# Patient Record
Sex: Female | Born: 1955 | Race: Black or African American | Hispanic: No | Marital: Married | Smoking: Never smoker
Health system: Southern US, Community
[De-identification: ages and names within clinical notes are randomized; demographics above are authoritative.]

## PROBLEM LIST (undated history)

## (undated) DIAGNOSIS — I10 Essential (primary) hypertension: Secondary | ICD-10-CM

---

## 1997-08-31 ENCOUNTER — Emergency Department (HOSPITAL_COMMUNITY): Admission: EM | Admit: 1997-08-31 | Discharge: 1997-08-31 | Payer: Self-pay

## 2001-01-12 ENCOUNTER — Emergency Department (HOSPITAL_COMMUNITY): Admission: EM | Admit: 2001-01-12 | Discharge: 2001-01-12 | Payer: Self-pay

## 2002-04-22 ENCOUNTER — Encounter: Payer: Self-pay | Admitting: Emergency Medicine

## 2002-04-22 ENCOUNTER — Emergency Department (HOSPITAL_COMMUNITY): Admission: EM | Admit: 2002-04-22 | Discharge: 2002-04-22 | Payer: Self-pay | Admitting: Emergency Medicine

## 2003-09-12 ENCOUNTER — Ambulatory Visit: Payer: Self-pay | Admitting: *Deleted

## 2004-06-19 ENCOUNTER — Emergency Department (HOSPITAL_COMMUNITY): Admission: EM | Admit: 2004-06-19 | Discharge: 2004-06-20 | Payer: Self-pay | Admitting: Emergency Medicine

## 2004-07-13 ENCOUNTER — Emergency Department (HOSPITAL_COMMUNITY): Admission: EM | Admit: 2004-07-13 | Discharge: 2004-07-13 | Payer: Self-pay | Admitting: Emergency Medicine

## 2004-08-20 ENCOUNTER — Ambulatory Visit: Payer: Self-pay | Admitting: Internal Medicine

## 2004-08-28 ENCOUNTER — Ambulatory Visit (HOSPITAL_COMMUNITY): Admission: RE | Admit: 2004-08-28 | Discharge: 2004-08-28 | Payer: Self-pay | Admitting: Family Medicine

## 2004-09-02 ENCOUNTER — Ambulatory Visit: Payer: Self-pay | Admitting: Internal Medicine

## 2004-09-25 ENCOUNTER — Ambulatory Visit: Payer: Self-pay | Admitting: Internal Medicine

## 2005-02-19 ENCOUNTER — Ambulatory Visit: Payer: Self-pay | Admitting: Internal Medicine

## 2005-05-27 ENCOUNTER — Ambulatory Visit: Payer: Self-pay | Admitting: Internal Medicine

## 2005-05-27 ENCOUNTER — Encounter: Payer: Self-pay | Admitting: Internal Medicine

## 2009-10-09 ENCOUNTER — Emergency Department (HOSPITAL_COMMUNITY): Admission: EM | Admit: 2009-10-09 | Discharge: 2009-10-09 | Payer: Self-pay | Admitting: Emergency Medicine

## 2009-12-26 ENCOUNTER — Emergency Department (HOSPITAL_COMMUNITY)
Admission: EM | Admit: 2009-12-26 | Discharge: 2009-12-26 | Payer: Self-pay | Source: Home / Self Care | Admitting: Emergency Medicine

## 2010-03-28 ENCOUNTER — Encounter (INDEPENDENT_AMBULATORY_CARE_PROVIDER_SITE_OTHER): Payer: Self-pay | Admitting: *Deleted

## 2010-03-28 LAB — CONVERTED CEMR LAB
ALT: 9 units/L (ref 0–35)
CO2: 25 meq/L (ref 19–32)
Chloride: 102 meq/L (ref 96–112)
Glucose, Bld: 72 mg/dL (ref 70–99)
Potassium: 4.7 meq/L (ref 3.5–5.3)
Total Protein: 7.8 g/dL (ref 6.0–8.3)

## 2010-08-12 ENCOUNTER — Other Ambulatory Visit: Payer: Self-pay | Admitting: Family Medicine

## 2012-05-23 ENCOUNTER — Encounter (HOSPITAL_COMMUNITY): Payer: Self-pay | Admitting: *Deleted

## 2012-05-23 ENCOUNTER — Emergency Department (HOSPITAL_COMMUNITY)
Admission: EM | Admit: 2012-05-23 | Discharge: 2012-05-23 | Disposition: A | Discharge status: Home / Self Care | Payer: Self-pay | Attending: Emergency Medicine | Admitting: Emergency Medicine

## 2012-05-23 DIAGNOSIS — I10 Essential (primary) hypertension: Secondary | ICD-10-CM | POA: Insufficient documentation

## 2012-05-23 DIAGNOSIS — K029 Dental caries, unspecified: Secondary | ICD-10-CM | POA: Insufficient documentation

## 2012-05-23 DIAGNOSIS — K089 Disorder of teeth and supporting structures, unspecified: Secondary | ICD-10-CM | POA: Insufficient documentation

## 2012-05-23 HISTORY — DX: Essential (primary) hypertension: I10

## 2012-05-23 MED ORDER — OXYCODONE-ACETAMINOPHEN 5-325 MG PO TABS: 2.0000 | ORAL_TABLET | Freq: Four times a day (QID) | ORAL | Status: DC | PRN

## 2012-05-23 NOTE — ED Notes (Signed)
Pt reports lower right side dental pain since Thursday. Airway intact

## 2012-05-23 NOTE — ED Provider Notes (Signed)
History     CSN: [id number]  Arrival date & time 05/23/12  4098   First MD Initiated Contact with Patient 05/23/12 0840      Chief Complaint  Patient presents with  . Dental Pain    (Consider location/radiation/quality/duration/timing/severity/associated sxs/prior treatment) The history is provided by the patient.  Mary Chan is a 57 y.o. female history of hypertension, poor dentition here presenting with right sided dental pain. Right lower dental pain for the last 3 days. She noticed that her filling came out 3 days ago and it's been painful when she eats. The tooth is also very since it to heat or cold. Denies any fevers or chills or facial swelling. Hasn't seen a dentist for several years.    Past Medical History  Diagnosis Date  . Hypertension     History reviewed. No pertinent past surgical history.  History reviewed. No pertinent family history.  History  Substance Use Topics  . Smoking status: Not on file  . Smokeless tobacco: Not on file  . Alcohol Use: No    OB History   Grav Para Term Preterm Abortions TAB SAB Ect Mult Living                  Review of Systems  HENT: Positive for dental problem.   All other systems reviewed and are negative.    Allergies  Review of patient's allergies indicates no known allergies.  Home Medications   Current Outpatient Rx  Name  Route  Sig  Dispense  Refill  . oxyCODONE-acetaminophen (PERCOCET) 5-325 MG per tablet   Oral   Take 2 tablets by mouth every 6 (six) hours as needed for pain.   8 tablet   0     BP 128/76  Pulse 88  Temp(Src) 98.8 F (37.1 C) (Oral)  Resp 18  SpO2 99%  Physical Exam  Nursing note and vitals reviewed. Constitutional: She appears well-developed and well-nourished.  HENT:  Head: Normocephalic.  Mouth/Throat:    Poor dentition overall with multiple fillings. R second molar there is a missing filling. No purulent drainage. Gums are not swollen and no evidence of abscess     Eyes: Conjunctivae are normal. Pupils are equal, round, and reactive to light.  Neck: Normal range of motion. Neck supple.  No cervical LAD   Cardiovascular: Normal rate, regular rhythm and normal heart sounds.   Pulmonary/Chest: Effort normal and breath sounds normal. No respiratory distress. She has no wheezes. She has no rales.  Abdominal: Soft. Bowel sounds are normal. She exhibits no distension. There is no tenderness. There is no rebound and no guarding.  Musculoskeletal: Normal range of motion.  Neurological: She is alert.  Skin: Skin is warm and dry.  Psychiatric: She has a normal mood and affect. Her behavior is normal. Judgment and thought content normal.    ED Course  Procedures (including critical care time)  Labs Reviewed - No data to display No results found.   1. Pain due to dental caries       MDM  Mary Chan is a 57 y.o. female here with missing filling. No evidence of infection. Will give short course of percocet prn. Will refer her to dental clinic.         Richardean Canal, MD 05/23/12 [phone]

## 2012-05-23 NOTE — ED Notes (Signed)
Pt states R lower dental pain, ongoing since Thursday, states she does not have dentist at the time. 9/10 pain. Respirations unlabored. No signs of acute distress at the time.

## 2012-08-25 ENCOUNTER — Emergency Department (HOSPITAL_COMMUNITY)
Admission: EM | Admit: 2012-08-25 | Discharge: 2012-08-25 | Disposition: A | Discharge status: Home / Self Care | Payer: No Typology Code available for payment source | Attending: Emergency Medicine | Admitting: Emergency Medicine

## 2012-08-25 ENCOUNTER — Encounter (HOSPITAL_COMMUNITY): Payer: Self-pay | Admitting: Emergency Medicine

## 2012-08-25 DIAGNOSIS — Z79899 Other long term (current) drug therapy: Secondary | ICD-10-CM | POA: Insufficient documentation

## 2012-08-25 DIAGNOSIS — K047 Periapical abscess without sinus: Secondary | ICD-10-CM

## 2012-08-25 DIAGNOSIS — K044 Acute apical periodontitis of pulpal origin: Secondary | ICD-10-CM | POA: Insufficient documentation

## 2012-08-25 DIAGNOSIS — I10 Essential (primary) hypertension: Secondary | ICD-10-CM | POA: Insufficient documentation

## 2012-08-25 DIAGNOSIS — Z792 Long term (current) use of antibiotics: Secondary | ICD-10-CM | POA: Insufficient documentation

## 2012-08-25 MED ORDER — IBUPROFEN 600 MG PO TABS: 600.0000 mg | ORAL_TABLET | Freq: Three times a day (TID) | ORAL | Status: AC | PRN

## 2012-08-25 MED ORDER — IBUPROFEN 200 MG PO TABS
600.0000 mg | ORAL_TABLET | Freq: Once | ORAL | Status: AC
  Administered 2012-08-25: 600 mg via ORAL
  Filled 2012-08-25: qty 3

## 2012-08-25 MED ORDER — OXYCODONE-ACETAMINOPHEN 5-325 MG PO TABS: 1.0000 | ORAL_TABLET | ORAL | Status: AC | PRN

## 2012-08-25 MED ORDER — PENICILLIN V POTASSIUM 500 MG PO TABS: 500.0000 mg | ORAL_TABLET | Freq: Four times a day (QID) | ORAL | Status: AC

## 2012-08-25 MED ORDER — OXYCODONE-ACETAMINOPHEN 5-325 MG PO TABS
1.0000 | ORAL_TABLET | Freq: Once | ORAL | Status: AC
  Administered 2012-08-25: 1 via ORAL
  Filled 2012-08-25: qty 1

## 2012-08-25 NOTE — ED Provider Notes (Signed)
CSN: [id number]     Arrival date & time 08/25/12  1014 History     First MD Initiated Contact with Patient 08/25/12 1119     Chief Complaint  Patient presents with  . Facial Swelling   HPI The patients presents with 5 days of dental pain. The pain is moderate. It is worsened by nothing. It is improved by nothing including NSAIDs. The patient denies recent dental trauma. They deny fever or chills. They have not sought prior care for this. It is described as aching and sore. The dental pain is located in right lower first molar. There is associated facial swelling.  No difficulty breathing or swallowing.  No fevers or chills.  Past Medical History  Diagnosis Date  . Hypertension    No past surgical history on file. No family history on file. History  Substance Use Topics  . Smoking status: Not on file  . Smokeless tobacco: Not on file  . Alcohol Use: No   OB History   Grav Para Term Preterm Abortions TAB SAB Ect Mult Living                 Review of Systems  All other systems reviewed and are negative.    Allergies  Review of patient's allergies indicates no known allergies.  Home Medications   Current Outpatient Rx  Name  Route  Sig  Dispense  Refill  . acetaminophen (TYLENOL) 500 MG tablet   Oral   Take 1,000 mg by mouth every 6 (six) hours as needed for pain.         Marland Kitchen amLODipine-benazepril (LOTREL) 10-20 MG per capsule   Oral   Take 1 capsule by mouth daily.         Marland Kitchen ibuprofen (ADVIL,MOTRIN) 200 MG tablet   Oral   Take 400 mg by mouth every 8 (eight) hours as needed for pain.         . Multiple Vitamin (MULTIVITAMIN WITH MINERALS) TABS tablet   Oral   Take 1 tablet by mouth daily.         Marland Kitchen ibuprofen (ADVIL,MOTRIN) 600 MG tablet   Oral   Take 1 tablet (600 mg total) by mouth every 8 (eight) hours as needed for pain.   15 tablet   0   . oxyCODONE-acetaminophen (PERCOCET/ROXICET) 5-325 MG per tablet   Oral   Take 1 tablet by mouth every 4  (four) hours as needed for pain.   20 tablet   0   . penicillin v potassium (VEETID) 500 MG tablet   Oral   Take 1 tablet (500 mg total) by mouth 4 (four) times daily.   40 tablet   0    BP 151/88  Pulse 76  Temp(Src) 98.4 F (36.9 C) (Oral)  SpO2 100% Physical Exam  Nursing note and vitals reviewed. Constitutional: She is oriented to person, place, and time. She appears well-developed and well-nourished. No distress.  HENT:  Head: Normocephalic and atraumatic.  Patient with evidence of dental decay in her right lower first molar.  She has gingival tenderness without swelling or fluctuance.  There is mild right-sided facial swelling.  Tolerating secretions.  Oral airway patent.  No swelling under her tongue.  Eyes: EOM are normal.  Neck: Normal range of motion.  Cardiovascular: Normal rate.   Pulmonary/Chest: Effort normal.  Abdominal: Soft.  Musculoskeletal: Normal range of motion.  Neurological: She is alert and oriented to person, place, and time.  Skin: Skin is warm  and dry.  Psychiatric: She has a normal mood and affect. Judgment normal.    ED Course   Procedures (including critical care time)  Labs Reviewed - No data to display No results found. 1. Dental infection     MDM  Dental Pain. Home with antibiotics and pain medicine. Recommend dental follow up. No signs of gingival abscess. Tolerating secretions. Airway patent. No sub lingular swelling   Lyanne Co, MD 08/25/12 [phone]

## 2012-08-25 NOTE — ED Notes (Signed)
Pt states that she has been having facial swelling and pain on the right side x 5 days.

## 2013-02-11 ENCOUNTER — Emergency Department (INDEPENDENT_AMBULATORY_CARE_PROVIDER_SITE_OTHER): Payer: No Typology Code available for payment source

## 2013-02-11 ENCOUNTER — Encounter (HOSPITAL_COMMUNITY): Payer: Self-pay | Admitting: Emergency Medicine

## 2013-02-11 ENCOUNTER — Emergency Department (INDEPENDENT_AMBULATORY_CARE_PROVIDER_SITE_OTHER)
Admission: EM | Admit: 2013-02-11 | Discharge: 2013-02-11 | Disposition: A | Discharge status: Home / Self Care | Payer: Self-pay | Source: Home / Self Care

## 2013-02-11 DIAGNOSIS — S139XXA Sprain of joints and ligaments of unspecified parts of neck, initial encounter: Secondary | ICD-10-CM

## 2013-02-11 DIAGNOSIS — M5090 Cervical disc disorder, unspecified, unspecified cervical region: Secondary | ICD-10-CM

## 2013-02-11 DIAGNOSIS — M542 Cervicalgia: Secondary | ICD-10-CM

## 2013-02-11 DIAGNOSIS — S134XXA Sprain of ligaments of cervical spine, initial encounter: Secondary | ICD-10-CM

## 2013-02-11 MED ORDER — IBUPROFEN 800 MG PO TABS
ORAL_TABLET | ORAL | Status: AC
Start: 2013-02-11 — End: ?

## 2013-02-11 MED ORDER — CYCLOBENZAPRINE HCL 10 MG PO TABS: 10.0000 mg | ORAL_TABLET | Freq: Two times a day (BID) | ORAL | Status: DC | PRN

## 2013-02-11 NOTE — ED Provider Notes (Signed)
Medical screening examination/treatment/procedure(s) were performed by non-physician practitioner and as supervising physician I was immediately available for consultation/collaboration.  Leslee Homeavid Abigial Newville, M.D.   Reuben Likesavid C Jefferson Fullam, MD 02/11/13 2110

## 2013-02-11 NOTE — Discharge Instructions (Signed)
Muscle Strain A muscle strain (pulled muscle) happens when a muscle is stretched beyond normal length. It happens when a sudden, violent force stretches your muscle too far. Usually, a few of the fibers in your muscle are torn. Muscle strain is common in athletes. Recovery usually takes 1 2 weeks. Complete healing takes 5 6 weeks.  HOME CARE   Follow the PRICE method of treatment to help your injury get better. Do this the first 2 3 days after the injury:  Protect. Protect the muscle to keep it from getting injured again.  Rest. Limit your activity and rest the injured body part.  Ice. Put ice in a plastic bag. Place a towel between your skin and the bag. Then, apply the ice and leave it on from 15 20 minutes each hour. After the third day, switch to moist heat packs.  Compression. Use a splint or elastic bandage on the injured area for comfort. Do not put it on too tightly.  Elevate. Keep the injured body part above the level of your heart.  Only take medicine as told by your doctor.  Warm up before doing exercise to prevent future muscle strains. GET HELP IF:   You have more pain or puffiness (swelling) in the injured area.  You feel numbness, tingling, or notice a loss of strength in the injured area. MAKE SURE YOU:   Understand these instructions.  Will watch your condition.  Will get help right away if you are not doing well or get worse. Document Released: 10/02/2007 Document Revised: 10/13/2012 Document Reviewed: 07/22/2012 [facility] Patient Information 2014 [place], [place].  Alternate heat and ice to neck. Gentle ROM. Use muscle relaxer only as needed. F/U with PCP if no improvement.
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
## 2014-03-10 ENCOUNTER — Emergency Department (INDEPENDENT_AMBULATORY_CARE_PROVIDER_SITE_OTHER): Payer: 59

## 2014-03-10 ENCOUNTER — Emergency Department (HOSPITAL_COMMUNITY)
Admission: EM | Admit: 2014-03-10 | Discharge: 2014-03-10 | Disposition: A | Discharge status: Home / Self Care | Payer: 59 | Source: Home / Self Care | Attending: Family Medicine | Admitting: Family Medicine

## 2014-03-10 DIAGNOSIS — J020 Streptococcal pharyngitis: Secondary | ICD-10-CM

## 2014-03-10 LAB — POCT RAPID STREP A: STREPTOCOCCUS, GROUP A SCREEN (DIRECT): POSITIVE — AB

## 2014-03-10 MED ORDER — CEFDINIR 300 MG PO CAPS: 300.0000 mg | ORAL_CAPSULE | Freq: Two times a day (BID) | ORAL | Status: DC

## 2014-03-10 NOTE — Discharge Instructions (Signed)
Drink lots of fluids, take all of medicine, use lozenges as needed.return if needed °

## 2014-03-10 NOTE — ED Provider Notes (Signed)
CSN: [id number]     Arrival date & time 03/10/14  1107 History   First MD Initiated Contact with Patient 03/10/14 1321     No chief complaint on file.  (Consider location/radiation/quality/duration/timing/severity/associated sxs/prior Treatment) Patient is a 59 y.o. female presenting with URI. The history is provided by the patient.  URI Presenting symptoms: congestion, cough, fever, rhinorrhea and sore throat   Severity:  Mild Onset quality:  Gradual Duration:  6 days Progression:  Unchanged Chronicity:  New Relieved by:  None tried Worsened by:  Nothing tried Ineffective treatments:  None tried Associated symptoms: wheezing   Risk factors: sick contacts     Past Medical History  Diagnosis Date  . Hypertension    No past surgical history on file. No family history on file. History  Substance Use Topics  . Smoking status: Never Smoker   . Smokeless tobacco: Not on file  . Alcohol Use: No   OB History    No data available     Review of Systems  Constitutional: Positive for fever.  HENT: Positive for congestion, postnasal drip, rhinorrhea and sore throat.   Respiratory: Positive for cough and wheezing. Negative for shortness of breath.   Cardiovascular: Negative.   Gastrointestinal: Negative.   Genitourinary: Negative.     Allergies  Review of patient's allergies indicates no known allergies.  Home Medications   Prior to Admission medications   Medication Sig Start Date End Date Taking? Authorizing Provider  acetaminophen (TYLENOL) 500 MG tablet Take 1,000 mg by mouth every 6 (six) hours as needed for pain.    Historical Provider, MD  amLODipine-benazepril (LOTREL) 10-20 MG per capsule Take 1 capsule by mouth daily.    Historical Provider, MD  cefdinir (OMNICEF) 300 MG capsule Take 1 capsule (300 mg total) by mouth 2 (two) times daily. 03/10/14   Linna HoffJames D Dustyn Armbrister, MD  cyclobenzaprine (FLEXERIL) 10 MG tablet Take 1 tablet (10 mg total) by mouth 2 (two) times daily as  needed for muscle spasms. 02/11/13   Riki SheerMichelle G Young, PA-C  ibuprofen (ADVIL,MOTRIN) 200 MG tablet Take 400 mg by mouth every 8 (eight) hours as needed for pain.    Historical Provider, MD  ibuprofen (ADVIL,MOTRIN) 600 MG tablet Take 1 tablet (600 mg total) by mouth every 8 (eight) hours as needed for pain. 08/25/12   Lyanne CoKevin M Campos, MD  ibuprofen (ADVIL,MOTRIN) 800 MG tablet 1 tablet tid for 7 days with food 02/11/13   Riki SheerMichelle G Young, PA-C  Multiple Vitamin (MULTIVITAMIN WITH MINERALS) TABS tablet Take 1 tablet by mouth daily.    Historical Provider, MD  oxyCODONE-acetaminophen (PERCOCET/ROXICET) 5-325 MG per tablet Take 1 tablet by mouth every 4 (four) hours as needed for pain. 08/25/12   Lyanne CoKevin M Campos, MD   BP 153/101 mmHg  Pulse 98  Temp(Src) 98.6 F (37 C) (Oral)  Resp 16  SpO2 100% Physical Exam  Constitutional: She is oriented to person, place, and time. She appears well-developed and well-nourished. No distress.  HENT:  Head: Normocephalic.  Right Ear: External ear normal.  Left Ear: External ear normal.  Mouth/Throat: Uvula is midline and mucous membranes are normal. Oropharyngeal exudate and posterior oropharyngeal erythema present.  Eyes: Pupils are equal, round, and reactive to light.  Neck: Normal range of motion. Neck supple.  Cardiovascular: Normal rate, normal heart sounds and intact distal pulses.   Pulmonary/Chest: She has wheezes. She has rales.  Abdominal: Soft. Bowel sounds are normal.  Lymphadenopathy:    She has no  cervical adenopathy.  Neurological: She is alert and oriented to person, place, and time.  Skin: Skin is warm and dry.  Nursing note and vitals reviewed.   ED Course  Procedures (including critical care time) Labs Review Labs Reviewed  POCT RAPID STREP A ([facility] ONLY) - Abnormal; Notable for the following:    Streptococcus, Group A Screen (Direct) POSITIVE (*)    All other components within normal limits   Strep pos. Imaging Review Dg  Chest 2 View  03/10/2014   CLINICAL DATA:  Throat pain. Productive cough. Short of breath. Initial encounter. Fever.  EXAM: CHEST  2 VIEW  COMPARISON:  None.  FINDINGS: Cardiopericardial silhouette within normal limits. Tortuosity of the thoracic aorta up. Lungs are clear. No airspace disease or effusion.  IMPRESSION: No active cardiopulmonary disease.   Electronically Signed   By: Andreas Newport M.D.   On: 03/10/2014 13:49   X-rays reviewed and report per radiologist.   MDM   1. Streptococcal sore throat       Linna Hoff, MD 03/11/14 1022

## 2015-02-21 ENCOUNTER — Emergency Department (INDEPENDENT_AMBULATORY_CARE_PROVIDER_SITE_OTHER)
Admission: EM | Admit: 2015-02-21 | Discharge: 2015-02-21 | Disposition: A | Discharge status: Home / Self Care | Payer: Self-pay | Source: Home / Self Care | Attending: Family Medicine | Admitting: Family Medicine

## 2015-02-21 ENCOUNTER — Encounter (HOSPITAL_COMMUNITY): Payer: Self-pay | Admitting: Emergency Medicine

## 2015-02-21 DIAGNOSIS — Z041 Encounter for examination and observation following transport accident: Secondary | ICD-10-CM

## 2015-02-21 DIAGNOSIS — T148 Other injury of unspecified body region: Secondary | ICD-10-CM

## 2015-02-21 DIAGNOSIS — T148XXA Other injury of unspecified body region, initial encounter: Secondary | ICD-10-CM

## 2015-02-21 DIAGNOSIS — S161XXA Strain of muscle, fascia and tendon at neck level, initial encounter: Secondary | ICD-10-CM

## 2015-02-21 DIAGNOSIS — S29012A Strain of muscle and tendon of back wall of thorax, initial encounter: Secondary | ICD-10-CM

## 2015-02-21 DIAGNOSIS — Z043 Encounter for examination and observation following other accident: Secondary | ICD-10-CM

## 2015-02-21 DIAGNOSIS — S233XXA Sprain of ligaments of thoracic spine, initial encounter: Secondary | ICD-10-CM

## 2015-02-21 MED ORDER — DICLOFENAC POTASSIUM 50 MG PO TABS: 50.0000 mg | ORAL_TABLET | Freq: Three times a day (TID) | ORAL | Status: AC

## 2015-02-21 MED ORDER — CYCLOBENZAPRINE HCL 5 MG PO TABS: 5.0000 mg | ORAL_TABLET | Freq: Three times a day (TID) | ORAL | Status: AC | PRN

## 2015-02-21 MED ORDER — TRAMADOL HCL 50 MG PO TABS: ORAL_TABLET | ORAL | Status: AC
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
## 2015-02-21 NOTE — Discharge Instructions (Signed)
Muscle Strain A muscle strain is an injury that occurs when a muscle is stretched beyond its normal length. Usually a small number of muscle fibers are torn when this happens. Muscle strain is rated in degrees. First-degree strains have the least amount of muscle fiber tearing and pain. Second-degree and third-degree strains have increasingly more tearing and pain.  Usually, recovery from muscle strain takes 1-2 weeks. Complete healing takes 5-6 weeks.  CAUSES  Muscle strain happens when a sudden, violent force placed on a muscle stretches it too far. This may occur with lifting, sports, or a fall.  RISK FACTORS Muscle strain is especially common in athletes.  SIGNS AND SYMPTOMS At the site of the muscle strain, there may be:  Pain.  Bruising.  Swelling.  Difficulty using the muscle due to pain or lack of normal function. DIAGNOSIS  Your health care provider will perform a physical exam and ask about your medical history. TREATMENT  Often, the best treatment for a muscle strain is resting, icing, and applying cold compresses to the injured area.  HOME CARE INSTRUCTIONS   Use the PRICE method of treatment to promote muscle healing during the first 2-3 days after your injury. The PRICE method involves:  Protecting the muscle from being injured again.  Restricting your activity and resting the injured body part.  Icing your injury. To do this, put ice in a plastic bag. Place a towel between your skin and the bag. Then, apply the ice and leave it on from 15-20 minutes each hour. After the third day, switch to moist heat packs.  Apply compression to the injured area with a splint or elastic bandage. Be careful not to wrap it too tightly. This may interfere with blood circulation or increase swelling.  Elevate the injured body part above the level of your heart as often as you can.  Only take over-the-counter or prescription medicines for pain, discomfort, or fever as directed by your  health care provider.  Warming up prior to exercise helps to prevent future muscle strains. SEEK MEDICAL CARE IF:   You have increasing pain or swelling in the injured area.  You have numbness, tingling, or a significant loss of strength in the injured area. MAKE SURE YOU:   Understand these instructions.  Will watch your condition.  Will get help right away if you are not doing well or get worse.   This information is not intended to replace advice given to you by your health care provider. Make sure you discuss any questions you have with your health care provider.   Document Released: 12/23/2004 Document Revised: 10/13/2012 Document Reviewed: 07/22/2012 Elsevier Interactive Patient Education 2016 [employer]  Cervical Strain and Sprain With Rehab Cervical strain and sprain are injuries that commonly occur with "whiplash" injuries. Whiplash occurs when the neck is forcefully whipped backward or forward, such as during a motor vehicle accident or during contact sports. The muscles, ligaments, tendons, discs, and nerves of the neck are susceptible to injury when this occurs. RISK FACTORS Risk of having a whiplash injury increases if:  Osteoarthritis of the spine.  Situations that make head or neck accidents or trauma more likely.  High-risk sports (football, rugby, wrestling, hockey, auto racing, gymnastics, diving, contact karate, or boxing).  Poor strength and flexibility of the neck.  Previous neck injury.  Poor tackling technique.  Improperly fitted or padded equipment. SYMPTOMS   Pain or stiffness in the front or back of neck or both.  Symptoms may present  immediately or up to 24 hours after injury.  Dizziness, headache, nausea, and vomiting.  Muscle spasm with soreness and stiffness in the neck.  Tenderness and swelling at the injury site. PREVENTION  Learn and use proper technique (avoid tackling with the head, spearing, and head-butting; use proper  falling techniques to avoid landing on the head).  Warm up and stretch properly before activity.  Maintain physical fitness:  Strength, flexibility, and endurance.  Cardiovascular fitness.  Wear properly fitted and padded protective equipment, such as padded soft collars, for participation in contact sports. PROGNOSIS  Recovery from cervical strain and sprain injuries is dependent on the extent of the injury. These injuries are usually curable in 1 week to 3 months with appropriate treatment.  RELATED COMPLICATIONS   Temporary numbness and weakness may occur if the nerve roots are damaged, and this may persist until the nerve has completely healed.  Chronic pain due to frequent recurrence of symptoms.  Prolonged healing, especially if activity is resumed too soon (before complete recovery). TREATMENT  Treatment initially involves the use of ice and medication to help reduce pain and inflammation. It is also important to perform strengthening and stretching exercises and modify activities that worsen symptoms so the injury does not get worse. These exercises may be performed at home or with a therapist. For patients who experience severe symptoms, a soft, padded collar may be recommended to be worn around the neck.  Improving your posture may help reduce symptoms. Posture improvement includes pulling your chin and abdomen in while sitting or standing. If you are sitting, sit in a firm chair with your buttocks against the back of the chair. While sleeping, try replacing your pillow with a small towel rolled to 2 inches in diameter, or use a cervical pillow or soft cervical collar. Poor sleeping positions delay healing.  For patients with nerve root damage, which causes numbness or weakness, the use of a cervical traction apparatus may be recommended. Surgery is rarely necessary for these injuries. However, cervical strain and sprains that are present at birth (congenital) may require  surgery. MEDICATION   If pain medication is necessary, nonsteroidal anti-inflammatory medications, such as aspirin and ibuprofen, or other minor pain relievers, such as acetaminophen, are often recommended.  Do not take pain medication for 7 days before surgery.  Prescription pain relievers may be given if deemed necessary by your caregiver. Use only as directed and only as much as you need. HEAT AND COLD:   Cold treatment (icing) relieves pain and reduces inflammation. Cold treatment should be applied for 10 to 15 minutes every 2 to 3 hours for inflammation and pain and immediately after any activity that aggravates your symptoms. Use ice packs or an ice massage.  Heat treatment may be used prior to performing the stretching and strengthening activities prescribed by your caregiver, physical therapist, or athletic trainer. Use a heat pack or a warm soak. SEEK MEDICAL CARE IF:   Symptoms get worse or do not improve in 2 weeks despite treatment.  New, unexplained symptoms develop (drugs used in treatment may produce side effects). EXERCISES RANGE OF MOTION (ROM) AND STRETCHING EXERCISES - Cervical Strain and Sprain These exercises may help you when beginning to rehabilitate your injury. In order to successfully resolve your symptoms, you must improve your posture. These exercises are designed to help reduce the forward-head and rounded-shoulder posture which contributes to this condition. Your symptoms may resolve with or without further involvement from your physician, physical therapist or athletic  trainer. While completing these exercises, remember:   Restoring tissue flexibility helps normal motion to return to the joints. This allows healthier, less painful movement and activity.  An effective stretch should be held for at least 20 seconds, although you may need to begin with shorter hold times for comfort.  A stretch should never be painful. You should only feel a gentle lengthening or  release in the stretched tissue. STRETCH- Axial Extensors  Lie on your back on the floor. You may bend your knees for comfort. Place a rolled-up hand towel or dish towel, about 2 inches in diameter, under the part of your head that makes contact with the floor.  Gently tuck your chin, as if trying to make a "double chin," until you feel a gentle stretch at the base of your head.  Hold __________ seconds. Repeat __________ times. Complete this exercise __________ times per day.  STRETCH - Axial Extension   Stand or sit on a firm surface. Assume a good posture: chest up, shoulders drawn back, abdominal muscles slightly tense, knees unlocked (if standing) and feet hip width apart.  Slowly retract your chin so your head slides back and your chin slightly lowers. Continue to look straight ahead.  You should feel a gentle stretch in the back of your head. Be certain not to feel an aggressive stretch since this can cause headaches later.  Hold for __________ seconds. Repeat __________ times. Complete this exercise __________ times per day. STRETCH - Cervical Side Bend   Stand or sit on a firm surface. Assume a good posture: chest up, shoulders drawn back, abdominal muscles slightly tense, knees unlocked (if standing) and feet hip width apart.  Without letting your nose or shoulders move, slowly tip your right / left ear to your shoulder until your feel a gentle stretch in the muscles on the opposite side of your neck.  Hold __________ seconds. Repeat __________ times. Complete this exercise __________ times per day. STRETCH - Cervical Rotators   Stand or sit on a firm surface. Assume a good posture: chest up, shoulders drawn back, abdominal muscles slightly tense, knees unlocked (if standing) and feet hip width apart.  Keeping your eyes level with the ground, slowly turn your head until you feel a gentle stretch along the back and opposite side of your neck.  Hold __________  seconds. Repeat __________ times. Complete this exercise __________ times per day. RANGE OF MOTION - Neck Circles   Stand or sit on a firm surface. Assume a good posture: chest up, shoulders drawn back, abdominal muscles slightly tense, knees unlocked (if standing) and feet hip width apart.  Gently roll your head down and around from the back of one shoulder to the back of the other. The motion should never be forced or painful.  Repeat the motion 10-20 times, or until you feel the neck muscles relax and loosen. Repeat __________ times. Complete the exercise __________ times per day. STRENGTHENING EXERCISES - Cervical Strain and Sprain These exercises may help you when beginning to rehabilitate your injury. They may resolve your symptoms with or without further involvement from your physician, physical therapist, or athletic trainer. While completing these exercises, remember:   Muscles can gain both the endurance and the strength needed for everyday activities through controlled exercises.  Complete these exercises as instructed by your physician, physical therapist, or athletic trainer. Progress the resistance and repetitions only as guided.  You may experience muscle soreness or fatigue, but the pain or discomfort you are  trying to eliminate should never worsen during these exercises. If this pain does worsen, stop and make certain you are following the directions exactly. If the pain is still present after adjustments, discontinue the exercise until you can discuss the trouble with your clinician. STRENGTH - Cervical Flexors, Isometric  Face a wall, standing about 6 inches away. Place a small pillow, a ball about 6-8 inches in diameter, or a folded towel between your forehead and the wall.  Slightly tuck your chin and gently push your forehead into the soft object. Push only with mild to moderate intensity, building up tension gradually. Keep your jaw and forehead relaxed.  Hold 10 to 20  seconds. Keep your breathing relaxed.  Release the tension slowly. Relax your neck muscles completely before you start the next repetition. Repeat __________ times. Complete this exercise __________ times per day. STRENGTH- Cervical Lateral Flexors, Isometric   Stand about 6 inches away from a wall. Place a small pillow, a ball about 6-8 inches in diameter, or a folded towel between the side of your head and the wall.  Slightly tuck your chin and gently tilt your head into the soft object. Push only with mild to moderate intensity, building up tension gradually. Keep your jaw and forehead relaxed.  Hold 10 to 20 seconds. Keep your breathing relaxed.  Release the tension slowly. Relax your neck muscles completely before you start the next repetition. Repeat __________ times. Complete this exercise __________ times per day. STRENGTH - Cervical Extensors, Isometric   Stand about 6 inches away from a wall. Place a small pillow, a ball about 6-8 inches in diameter, or a folded towel between the back of your head and the wall.  Slightly tuck your chin and gently tilt your head back into the soft object. Push only with mild to moderate intensity, building up tension gradually. Keep your jaw and forehead relaxed.  Hold 10 to 20 seconds. Keep your breathing relaxed.  Release the tension slowly. Relax your neck muscles completely before you start the next repetition. Repeat __________ times. Complete this exercise __________ times per day. POSTURE AND BODY MECHANICS CONSIDERATIONS - Cervical Strain and Sprain Keeping correct posture when sitting, standing or completing your activities will reduce the stress put on different body tissues, allowing injured tissues a chance to heal and limiting painful experiences. The following are general guidelines for improved posture. Your physician or physical therapist will provide you with any instructions specific to your needs. While reading these guidelines,  remember:  The exercises prescribed by your provider will help you have the flexibility and strength to maintain correct postures.  The correct posture provides the optimal environment for your joints to work. All of your joints have less wear and tear when properly supported by a spine with good posture. This means you will experience a healthier, less painful body.  Correct posture must be practiced with all of your activities, especially prolonged sitting and standing. Correct posture is as important when doing repetitive low-stress activities (typing) as it is when doing a single heavy-load activity (lifting). PROLONGED STANDING WHILE SLIGHTLY LEANING FORWARD When completing a task that requires you to lean forward while standing in one place for a long time, place either foot up on a stationary 2- to 4-inch high object to help maintain the best posture. When both feet are on the ground, the low back tends to lose its slight inward curve. If this curve flattens (or becomes too large), then the back and your other  joints will experience too much stress, fatigue more quickly, and can cause pain.  RESTING POSITIONS Consider which positions are most painful for you when choosing a resting position. If you have pain with flexion-based activities (sitting, bending, stooping, squatting), choose a position that allows you to rest in a less flexed posture. You would want to avoid curling into a fetal position on your side. If your pain worsens with extension-based activities (prolonged standing, working overhead), avoid resting in an extended position such as sleeping on your stomach. Most people will find more comfort when they rest with their spine in a more neutral position, neither too rounded nor too arched. Lying on a non-sagging bed on your side with a pillow between your knees, or on your back with a pillow under your knees will often provide some relief. Keep in mind, being in any one position for a  prolonged period of time, no matter how correct your posture, can still lead to stiffness. WALKING Walk with an upright posture. Your ears, shoulders, and hips should all line up. OFFICE WORK When working at a desk, create an environment that supports good, upright posture. Without extra support, muscles fatigue and lead to excessive strain on joints and other tissues. CHAIR:  A chair should be able to slide under your desk when your back makes contact with the back of the chair. This allows you to work closely.  The chair's height should allow your eyes to be level with the upper part of your monitor and your hands to be slightly lower than your elbows.  Body position:  Your feet should make contact with the floor. If this is not possible, use a foot rest.  Keep your ears over your shoulders. This will reduce stress on your neck and low back.   This information is not intended to replace advice given to you by your health care provider. Make sure you discuss any questions you have with your health care provider.   Document Released: 12/23/2004 Document Revised: 01/13/2014 Document Reviewed: 04/06/2008 Elsevier Interactive Patient Education 2016 [employer]  Thoracic Strain A thoracic strain, which is sometimes called a mid-back strain, is an injury to the muscles or tendons that attach to the upper part of your back behind your chest. This type of injury occurs when a muscle is overstretched or overloaded.  Thoracic strains can range from mild to severe. Mild strains may involve stretching a muscle or tendon without tearing it. These injuries may heal in 1-2 weeks. More severe strains involve tearing of muscle fibers or tendons. These will cause more pain and may take 6-8 weeks to heal. CAUSES This condition may be caused by:  An injury in which a sudden force is placed on the muscle.  Exercising without properly warming up.  Overuse of the muscle.  Improper form during certain  movements.  Other injuries that surround or cause stress on the mid-back, causing a strain on the muscles. In some cases, the cause may not be known. RISK FACTORS This injury is more common in:  Athletes.  People with obesity. SYMPTOMS The main symptom of this condition is pain, especially with movement. Other symptoms include:  Bruising.  Swelling.  Spasm. DIAGNOSIS This condition may be diagnosed with a physical exam. X-rays may be taken to check for a fracture. TREATMENT This condition may be treated with:  Resting and icing the injured area.  Physical therapy. This will involve doing stretching and strengthening exercises.  Medicines for pain and inflammation.  HOME CARE INSTRUCTIONS  Rest as needed. Follow instructions from your health care provider about any restrictions on activity.  If directed, apply ice to the injured area:  Put ice in a plastic bag.  Place a towel between your skin and the bag.  Leave the ice on for 20 minutes, 2-3 times per day.  Take over-the-counter and prescription medicines only as told by your health care provider.  Begin doing exercises as told by your health care provider or physical therapist.  Always warm up properly before physical activity or sports.  Bend your knees before you lift heavy objects.  Keep all follow-up visits as told by your health care provider. This is important. SEEK MEDICAL CARE IF:  Your pain is not helped by medicine.  Your pain, bruising, or swelling is getting worse.  You have a fever. SEEK IMMEDIATE MEDICAL CARE IF:  You have shortness of breath.  You have chest pain.  You develop numbness or weakness in your legs.  You have involuntary loss of urine (urinary incontinence).   This information is not intended to replace advice given to you by your health care provider. Make sure you discuss any questions you have with your health care provider.   Document Released: 03/15/2003 Document  Revised: 09/13/2014 Document Reviewed: 02/16/2014 Elsevier Interactive Patient Education [employer]
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
## 2015-06-11 ENCOUNTER — Other Ambulatory Visit: Payer: Self-pay | Admitting: Internal Medicine
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
## 2020-12-09 ENCOUNTER — Encounter (HOSPITAL_BASED_OUTPATIENT_CLINIC_OR_DEPARTMENT_OTHER): Payer: Self-pay | Admitting: *Deleted

## 2020-12-09 ENCOUNTER — Emergency Department (HOSPITAL_BASED_OUTPATIENT_CLINIC_OR_DEPARTMENT_OTHER)
Admission: EM | Admit: 2020-12-09 | Discharge: 2020-12-09 | Disposition: A | Discharge status: Home / Self Care | Payer: BLUE CROSS/BLUE SHIELD | Attending: Emergency Medicine | Admitting: Emergency Medicine

## 2020-12-09 ENCOUNTER — Other Ambulatory Visit: Payer: Self-pay

## 2020-12-09 DIAGNOSIS — Z79899 Other long term (current) drug therapy: Secondary | ICD-10-CM | POA: Insufficient documentation

## 2020-12-09 DIAGNOSIS — K029 Dental caries, unspecified: Secondary | ICD-10-CM | POA: Insufficient documentation

## 2020-12-09 DIAGNOSIS — K0889 Other specified disorders of teeth and supporting structures: Secondary | ICD-10-CM | POA: Diagnosis present

## 2020-12-09 DIAGNOSIS — I10 Essential (primary) hypertension: Secondary | ICD-10-CM | POA: Insufficient documentation

## 2020-12-09 MED ORDER — CLINDAMYCIN HCL 300 MG PO CAPS: 300.0000 mg | ORAL_CAPSULE | Freq: Three times a day (TID) | ORAL | 0 refills | Status: DC

## 2020-12-09 MED ORDER — CLINDAMYCIN HCL 300 MG PO CAPS: 300.0000 mg | ORAL_CAPSULE | Freq: Three times a day (TID) | ORAL | 0 refills | Status: AC

## 2020-12-09 NOTE — ED Notes (Signed)
AVS with prescriptions provided to and discussed with patient. Pt verbalizes understanding of discharge instructions and denies any questions or concerns at this time. Pt ambulated out of department independently with steady gait. ? ?

## 2020-12-09 NOTE — ED Notes (Signed)
ED Provider at bedside. 

## 2020-12-09 NOTE — ED Provider Notes (Signed)
[facility] EMERGENCY DEPARTMENT Provider Note   CSN: [id number] Arrival date & time: 12/09/20  0946     History Chief Complaint  Patient presents with   Facial Swelling    Mary Chan is a 65 y.o. female.  The history is provided by the patient.  Dental Pain Location:  Lower Quality:  Aching Severity:  Mild Onset quality:  Gradual Duration:  3 days Timing:  Constant Progression:  Worsening Chronicity:  New Context: dental caries   Associated symptoms: facial swelling   Associated symptoms: no congestion, no difficulty swallowing, no drooling, no facial pain, no fever, no gum swelling, no headaches, no neck pain, no neck swelling, no oral bleeding, no oral lesions and no trismus       Past Medical History:  Diagnosis Date   Hypertension     There are no problems to display for this patient.   History reviewed. No pertinent surgical history.   OB History   No obstetric history on file.     History reviewed. No pertinent family history.  Social History   Tobacco Use   Smoking status: Never  Substance Use Topics   Alcohol use: No   Drug use: No    Home Medications Prior to Admission medications   Medication Sig Start Date End Date Taking? Authorizing Provider  clindamycin (CLEOCIN) 300 MG capsule Take 1 capsule (300 mg total) by mouth 3 (three) times daily for 10 days. 12/09/20 12/19/20 Yes Hagan Maltz, DO  acetaminophen (TYLENOL) 500 MG tablet Take 1,000 mg by mouth every 6 (six) hours as needed for pain.    [provider]  amLODipine-benazepril (LOTREL) 10-20 MG per capsule Take 1 capsule by mouth daily.    [provider]  cyclobenzaprine (FLEXERIL) 5 MG tablet Take 1 tablet (5 mg total) by mouth 3 (three) times daily as needed for muscle spasms. 02/21/15   Hayden Rasmussen, NP  diclofenac (CATAFLAM) 50 MG tablet Take 1 tablet (50 mg total) by mouth 3 (three) times daily. One tablet TID with food prn pain. 02/21/15   Hayden Rasmussen, NP  ibuprofen (ADVIL,MOTRIN) 200 MG tablet Take 400 mg by mouth every 8 (eight) hours as needed for pain.    [provider]  ibuprofen (ADVIL,MOTRIN) 600 MG tablet Take 1 tablet (600 mg total) by mouth every 8 (eight) hours as needed for pain. 08/25/12   Azalia Bilis, MD  ibuprofen (ADVIL,MOTRIN) 800 MG tablet 1 tablet tid for 7 days with food 02/11/13   Riki Sheer, PA-C  Multiple Vitamin (MULTIVITAMIN WITH MINERALS) TABS tablet Take 1 tablet by mouth daily.    [provider]  oxyCODONE-acetaminophen (PERCOCET/ROXICET) 5-325 MG per tablet Take 1 tablet by mouth every 4 (four) hours as needed for pain. 08/25/12   Azalia Bilis, MD  traMADol (ULTRAM) 50 MG tablet 1-2 tabs po q 6 hr prn pain Maximum dose= 8 tablets per day 02/21/15   Hayden Rasmussen, NP    Allergies    Patient has no known allergies.  Review of Systems   Review of Systems  Constitutional:  Negative for fever.  HENT:  Positive for facial swelling. Negative for congestion, drooling and mouth sores.   Musculoskeletal:  Negative for neck pain.  Neurological:  Negative for headaches.   Physical Exam Updated Vital Signs BP (!) 164/97 (BP Location: Right Arm)   Pulse 70   Temp 98.1 F (36.7 C) (Oral)   Resp 18   Ht 5\' 2"  (1.575 m)  Wt 72.6 kg   SpO2 99%   BMI 29.26 kg/m   Physical Exam HENT:     Head: Normocephalic.     Nose: Nose normal.     Comments: Some dental caries in the right lower mouth, there is no obvious swelling of the face or gumline.  No trismus, no drooling, no stridor, no submandibular swelling    Mouth/Throat:     Mouth: Mucous membranes are moist.     Pharynx: No oropharyngeal exudate or posterior oropharyngeal erythema.  Eyes:     Pupils: Pupils are equal, round, and reactive to light.  Neurological:     Mental Status: She is alert.    ED Results / Procedures / Treatments   Labs (all labs ordered are listed, but only abnormal results are displayed) Labs Reviewed -  No data to display  EKG None  Radiology No results found.  Procedures Procedures   Medications Ordered in ED Medications - No data to display  ED Course  I have reviewed the triage vital signs and the nursing notes.  Pertinent labs & imaging results that were available during my care of the patient were reviewed by me and considered in my medical decision making (see chart for details).    MDM Rules/Calculators/A&P                           Mary Chan is here with dental pain, right-sided facial swelling.  Overall appears to be possibly an uncomplicated dental infection.  No obvious abscess.  No major swelling of the face.  No submandibular swelling.  No trismus, no drooling.  Provide information to follow-up with dentistry.  She has her own dentist as well.  We will start her antibiotics.  Discharged in good condition.  Understands return precautions.  This chart was dictated using voice recognition software.  Despite best efforts to proofread,  errors can occur which can change the documentation meaning.   Final Clinical Impression(s) / ED Diagnoses Final diagnoses:  Dental caries    Rx / DC Orders ED Discharge Orders          Ordered    clindamycin (CLEOCIN) 300 MG capsule  3 times daily        12/09/20 0954             Virgina Norfolk, DO 12/09/20 [phone]

## 2020-12-09 NOTE — ED Triage Notes (Signed)
Presents with rt sided facial swelling. Onset 3 days ago.
